# Patient Record
Sex: Female | Born: 2000 | Race: Black or African American | Hispanic: No | Marital: Single | Smoking: Never smoker
Health system: Southern US, Community
[De-identification: ages and names within clinical notes are randomized; demographics above are authoritative.]

## PROBLEM LIST (undated history)

## (undated) ENCOUNTER — Ambulatory Visit: Admission: EM | Payer: Medicaid Other | Source: Home / Self Care

---
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
## 2022-03-09 ENCOUNTER — Ambulatory Visit
Admission: RE | Admit: 2022-03-09 | Discharge: 2022-03-09 | Disposition: A | Discharge status: Home / Self Care | Payer: Medicaid Other | Source: Ambulatory Visit | Attending: Emergency Medicine | Admitting: Emergency Medicine

## 2022-03-09 VITALS — BP 98/54 | HR 89 | Temp 97.8°F | Resp 16

## 2022-03-09 DIAGNOSIS — Z8619 Personal history of other infectious and parasitic diseases: Secondary | ICD-10-CM | POA: Diagnosis not present

## 2022-03-09 DIAGNOSIS — Z113 Encounter for screening for infections with a predominantly sexual mode of transmission: Secondary | ICD-10-CM | POA: Diagnosis not present

## 2022-03-09 DIAGNOSIS — Z3202 Encounter for pregnancy test, result negative: Secondary | ICD-10-CM | POA: Diagnosis not present

## 2022-03-09 DIAGNOSIS — N760 Acute vaginitis: Secondary | ICD-10-CM | POA: Diagnosis not present

## 2022-03-09 LAB — POCT URINALYSIS DIP (MANUAL ENTRY)
Bilirubin, UA: NEGATIVE
Glucose, UA: NEGATIVE mg/dL
Ketones, POC UA: NEGATIVE mg/dL
Leukocytes, UA: NEGATIVE
Nitrite, UA: NEGATIVE
Protein Ur, POC: NEGATIVE mg/dL
Spec Grav, UA: >=1.03 — AB (ref 1.010–1.025)
Urobilinogen, UA: 0.2 E.U./dL
pH, UA: 5.5 (ref 5.0–8.0)

## 2022-03-09 LAB — POCT URINE PREGNANCY: Preg Test, Ur: NEGATIVE

## 2022-03-09 NOTE — ED Provider Notes (Signed)
[facility]    CSN: [id number] Arrival date & time: 03/09/22  0957    HISTORY   Chief Complaint  Patient presents with   Vaginal Itching    Entered by patient   HPI Bethany Thomas is a pleasant, 21 y.o. female who presents to urgent care today. Patient complains of vaginal itching and cramping in her lower abdomen for the past 4 days.  Patient states has not tried thing to alleviate her symptoms.  Patient has a history of vaginal Candida x 2 in the past year, tested positive for gonorrhea January of this year.  Patient endorses suprapubic pain, vaginal itching, and vaginal irritation.  Patient denies abnormal odor of urine, burning with urination, increased frequency of urination, perineal pain, incontinence of urine, flank pain, fever, chills, malaise, rigors, significant fatigue, abnormal vaginal discharge, genital lesion(s), and known exposure to STD.     The history is provided by the patient.  January of this year. History reviewed. No pertinent past medical history. Patient Active Problem List   Diagnosis Date Noted   Encounter for birth control 11/18/2021   Molluscum contagiosum 11/18/2021   History reviewed. No pertinent surgical history. OB History   No obstetric history on file.    Home Medications    Prior to Admission medications   Medication Sig Start Date End Date Taking? Authorizing Provider  KETOCONAZOLE, TOPICAL, 1 % SHAM Lather one application to affected area twice weekly, must leave on for five minutes prior to rinsing off 08/13/21   Crain, [place] L, PA    Family History Family History  Problem Relation Age of Onset   Healthy Mother    Healthy Father    Social History Social History   Tobacco Use   Smoking status: Never   Smokeless tobacco: Never  Vaping Use   Vaping Use: Never used  Substance Use Topics   Alcohol use: Yes    Comment: socially   Drug use: Yes    Types: Marijuana   Allergies   Patient has no known  allergies.  Review of Systems Review of Systems Pertinent findings revealed after performing a 14 point review of systems has been noted in the history of present illness.  Physical Exam Triage Vital Signs ED Triage Vitals  Enc Vitals Group     BP 01/24/21 0827 (!) 147/82     Pulse Rate 01/24/21 0827 72     Resp 01/24/21 0827 18     Temp 01/24/21 0827 98.3 F (36.8 C)     Temp Source 01/24/21 0827 Oral     SpO2 01/24/21 0827 98 %     Weight --      Height --      Head Circumference --      Peak Flow --      Pain Score 01/24/21 0826 5     Pain Loc --      Pain Edu? --      Excl. in GC? --   No data found.  Updated Vital Signs BP (!) 98/54 (BP Location: Left Arm)   Pulse 89   Temp 97.8 F (36.6 C) (Oral)   Resp 16   LMP 02/08/2022   SpO2 97%   Physical Exam Vitals and nursing note reviewed.  Constitutional:      General: She is not in acute distress.    Appearance: Normal appearance. She is not ill-appearing.  HENT:     Head: Normocephalic and atraumatic.  Eyes:     General:  Lids are normal.        Right eye: No discharge.        Left eye: No discharge.     Extraocular Movements: Extraocular movements intact.     Conjunctiva/sclera: Conjunctivae normal.     Right eye: Right conjunctiva is not injected.     Left eye: Left conjunctiva is not injected.  Neck:     Trachea: Trachea and phonation normal.  Cardiovascular:     Rate and Rhythm: Normal rate and regular rhythm.     Pulses: Normal pulses.     Heart sounds: Normal heart sounds. No murmur heard.    No friction rub. No gallop.  Pulmonary:     Effort: Pulmonary effort is normal. No accessory muscle usage, prolonged expiration or respiratory distress.     Breath sounds: Normal breath sounds. No stridor, decreased air movement or transmitted upper airway sounds. No decreased breath sounds, wheezing, rhonchi or rales.  Chest:     Chest wall: No tenderness.  Genitourinary:    Comments: Patient politely  declines pelvic exam today, patient provided a vaginal swab for testing. Musculoskeletal:        General: Normal range of motion.     Cervical back: Normal range of motion and neck supple. Normal range of motion.  Lymphadenopathy:     Cervical: No cervical adenopathy.  Skin:    General: Skin is warm and dry.     Findings: No erythema or rash.  Neurological:     General: No focal deficit present.     Mental Status: She is alert and oriented to person, place, and time.  Psychiatric:        Mood and Affect: Mood normal.        Behavior: Behavior normal.     Visual Acuity Right Eye Distance:   Left Eye Distance:   Bilateral Distance:    Right Eye Near:   Left Eye Near:    Bilateral Near:     UC Couse / Diagnostics / Procedures:     Radiology No results found.  Procedures Procedures (including critical care time) EKG  Pending results:  Labs Reviewed  POCT URINALYSIS DIP (MANUAL ENTRY) - Abnormal; Notable for the following components:      Result Value   Spec Grav, UA >=1.030 (*)    Blood, UA trace-intact (*)    All other components within normal limits  POCT URINE PREGNANCY  CERVICOVAGINAL ANCILLARY ONLY    Medications Ordered in UC: Medications - No data to display  UC Diagnoses / Final Clinical Impressions(s)   I have reviewed the triage vital signs and the nursing notes.  Pertinent labs & imaging results that were available during my care of the patient were reviewed by me and considered in my medical decision making (see chart for details).    Final diagnoses:  Screening examination for STD (sexually transmitted disease)  Vaginitis and vulvovaginitis  History of gonorrhea   STD screening was performed, patient advised that the results be posted to their MyChart and if any of the results are positive, they will be notified by phone, further treatment will be provided as indicated based on results of STD screening. Patient was advised to abstain from sexual  intercourse until that they receive the results of their STD testing.  Patient was also advised to use condoms to protect themselves from STD exposure. Urinalysis today was normal. Urine pregnancy test was negative. Return precautions advised.  Drug allergies reviewed, all questions addressed.  Please see discharge instructions below for details of plan of care as provided to patient. ED Prescriptions   None    PDMP not reviewed this encounter.  Disposition Upon Discharge:  Condition: stable for discharge home  Patient presented with concern for an acute illness with associated systemic symptoms and significant discomfort requiring urgent management. In my opinion, this is a condition that a prudent lay person (someone who possesses an average knowledge of health and medicine) may potentially expect to result in complications if not addressed urgently such as respiratory distress, impairment of bodily function or dysfunction of bodily organs.   As such, the patient has been evaluated and assessed, work-up was performed and treatment was provided in alignment with urgent care protocols and evidence based medicine.  Patient/parent/caregiver has been advised that the patient may require follow up for further testing and/or treatment if the symptoms continue in spite of treatment, as clinically indicated and appropriate.  Routine symptom specific, illness specific and/or disease specific instructions were discussed with the patient and/or caregiver at length.  Prevention strategies for avoiding STD exposure were also discussed.  The patient will follow up with their current PCP if and as advised. If the patient does not currently have a PCP we will assist them in obtaining one.   The patient may need specialty follow up if the symptoms continue, in spite of conservative treatment and management, for further workup, evaluation, consultation and treatment as clinically indicated and  appropriate.  Patient/parent/caregiver verbalized understanding and agreement of plan as discussed.  All questions were addressed during visit.  Please see discharge instructions below for further details of plan.  Discharge Instructions:   Discharge Instructions      The results of your vaginal swab test which screens for BV, yeast, gonorrhea, chlamydia and trichomonas will be made posted to your MyChart account once it is complete.  This typically takes 2 to 4 days.  Please abstain from sexual intercourse of any kind, vaginal, oral or anal, until you have received the results of your STD testing.     If any of your results are abnormal, you will receive a phone call regarding treatment.  Prescriptions, if any are needed, will be provided for you at your pharmacy.     Your urine pregnancy test today is negative.   Our point-of-care analysis of your urine sample today was normal and did not reveal any concern for urinary tract infection.   If you have not had complete resolution of your symptoms after completing any needed treatment, please return for repeat evaluation.   Thank you for visiting urgent care today.  I appreciate the opportunity to participate in your care.       This office note has been dictated using Teaching laboratory technician.  Unfortunately, this method of dictation can sometimes lead to typographical or grammatical errors.  I apologize for your inconvenience in advance if this occurs.  Please do not hesitate to reach out to me if clarification is needed.       Theadora Rama Scales, PA-C 03/09/22 1103

## 2022-03-09 NOTE — Discharge Instructions (Signed)
The results of your vaginal swab test which screens for BV, yeast, gonorrhea, chlamydia and trichomonas will be made posted to your MyChart account once it is complete.  This typically takes 2 to 4 days.  Please abstain from sexual intercourse of any kind, vaginal, oral or anal, until you have received the results of your STD testing.     If any of your results are abnormal, you will receive a phone call regarding treatment.  Prescriptions, if any are needed, will be provided for you at your pharmacy.     Your urine pregnancy test today is negative.   Our point-of-care analysis of your urine sample today was normal and did not reveal any concern for urinary tract infection.   If you have not had complete resolution of your symptoms after completing any needed treatment, please return for repeat evaluation.   Thank you for visiting urgent care today.  I appreciate the opportunity to participate in your care.  

## 2022-03-09 NOTE — ED Triage Notes (Signed)
Pt c/o vaginal discomfort and lower abd cramping x4 days.   Home interventions: none

## 2022-03-10 ENCOUNTER — Telehealth (HOSPITAL_COMMUNITY): Payer: Self-pay | Admitting: Emergency Medicine

## 2022-03-10 LAB — CERVICOVAGINAL ANCILLARY ONLY
Bacterial Vaginitis (gardnerella): NEGATIVE
Candida Glabrata: NEGATIVE
Candida Vaginitis: POSITIVE — AB
Chlamydia: NEGATIVE
Comment: NEGATIVE
Comment: NEGATIVE
Comment: NEGATIVE
Comment: NEGATIVE
Comment: NEGATIVE
Comment: NORMAL
Neisseria Gonorrhea: NEGATIVE
Trichomonas: NEGATIVE

## 2022-03-10 MED ORDER — FLUCONAZOLE 150 MG PO TABS: 150.0000 mg | ORAL_TABLET | Freq: Once | ORAL | 0 refills | Status: AC

## 2022-04-22 DIAGNOSIS — N939 Abnormal uterine and vaginal bleeding, unspecified: Secondary | ICD-10-CM | POA: Diagnosis not present

## 2022-09-04 ENCOUNTER — Ambulatory Visit
Admission: EM | Admit: 2022-09-04 | Discharge: 2022-09-04 | Disposition: A | Discharge status: Home / Self Care | Payer: Medicaid Other | Attending: Urgent Care | Admitting: Urgent Care

## 2022-09-04 DIAGNOSIS — N760 Acute vaginitis: Secondary | ICD-10-CM | POA: Insufficient documentation

## 2022-09-04 LAB — POCT URINE PREGNANCY: Preg Test, Ur: NEGATIVE

## 2022-09-04 NOTE — ED Provider Notes (Signed)
[facility]  Note:  This document was prepared using Conservation officer, historic buildings and may include unintentional dictation errors.  MRN: [id number] DOB: [birth year]  Subjective:   Bethany Thomas is a 22 y.o. female presenting for 1 week history of vaginal discharge.  Patient has a history of yeast infections.  Has not been very frequent.  She is also had BV in the past.  No concern for STI but would like to get all the testing done through the vaginal cytology.  Denies fever, n/v, abdominal pain, pelvic pain, rashes, dysuria, urinary frequency, hematuria.    No current facility-administered medications for this encounter.  Current Outpatient Medications:    KETOCONAZOLE, TOPICAL, 1 % SHAM, Lather one application to affected area twice weekly, must leave on for five minutes prior to rinsing off, Disp: 200 mL, Rfl: 0   No Known Allergies  History reviewed. No pertinent past medical history.   History reviewed. No pertinent surgical history.  Family History  Problem Relation Age of Onset   Healthy Mother    Healthy Father     Social History   Tobacco Use   Smoking status: Never   Smokeless tobacco: Never  Vaping Use   Vaping Use: Never used  Substance Use Topics   Alcohol use: Yes    Comment: occ   Drug use: Yes    Types: Marijuana    ROS   Objective:   Vitals: BP 110/80 (BP Location: Right Arm)   Pulse 80   Temp 99.1 F (37.3 C) (Oral)   Resp 16   LMP 08/20/2022 (Approximate)   SpO2 98%   Physical Exam Constitutional:      General: She is not in acute distress.    Appearance: Normal appearance. She is well-developed. She is not ill-appearing, toxic-appearing or diaphoretic.  HENT:     Head: Normocephalic and atraumatic.     Nose: Nose normal.     Mouth/Throat:     Mouth: Mucous membranes are moist.  Eyes:     General: No scleral icterus.       Right eye: No discharge.        Left eye: No discharge.     Extraocular Movements:  Extraocular movements intact.  Cardiovascular:     Rate and Rhythm: Normal rate.  Pulmonary:     Effort: Pulmonary effort is normal.  Skin:    General: Skin is warm and dry.  Neurological:     General: No focal deficit present.     Mental Status: She is alert and oriented to person, place, and time.  Psychiatric:        Mood and Affect: Mood normal.        Behavior: Behavior normal.     Results for orders placed or performed during the hospital encounter of 09/04/22 (from the past 24 hour(s))  POCT urine pregnancy     Status: None   Collection Time: 09/04/22  9:50 AM  Result Value Ref Range   Preg Test, Ur Negative Negative    Assessment and Plan :   PDMP not reviewed this encounter.  1. Acute vaginitis    I offered empiric treatment for recurrent yeast vaginitis but patient declined for now.  She prefers to see results first.  I did offer that if she continues to have symptoms over the weekend that she can call the clinic back if she changes her mind for prescription for fluconazole.  Otherwise we will follow-up as the results return.  Wallis Bamberg, [place] 09/04/22 [phone]

## 2022-09-04 NOTE — Discharge Instructions (Addendum)
We will let you know what your results are on Monday. In the meantime, if your symptoms worsen over the weekend, call the clinic and I will send a prescription for fluconazole to address a suspected yeast infection.

## 2022-09-04 NOTE — ED Triage Notes (Signed)
Pt c/o vaginal d/c x 1 week-requesting STD screening-NAD-steady gait 

## 2022-09-07 LAB — CERVICOVAGINAL ANCILLARY ONLY
Bacterial Vaginitis (gardnerella): NEGATIVE
Candida Glabrata: NEGATIVE
Candida Vaginitis: POSITIVE — AB
Chlamydia: NEGATIVE
Comment: NEGATIVE
Comment: NEGATIVE
Comment: NEGATIVE
Comment: NEGATIVE
Comment: NEGATIVE
Comment: NORMAL
Neisseria Gonorrhea: NEGATIVE
Trichomonas: NEGATIVE

## 2022-09-08 ENCOUNTER — Telehealth (HOSPITAL_COMMUNITY): Payer: Self-pay | Admitting: Emergency Medicine

## 2022-09-08 MED ORDER — FLUCONAZOLE 150 MG PO TABS: 150.0000 mg | ORAL_TABLET | Freq: Once | ORAL | 0 refills | Status: AC

## 2022-12-01 DIAGNOSIS — N760 Acute vaginitis: Secondary | ICD-10-CM | POA: Diagnosis not present

## 2023-01-07 DIAGNOSIS — R519 Headache, unspecified: Secondary | ICD-10-CM | POA: Diagnosis not present

## 2023-03-17 ENCOUNTER — Ambulatory Visit
Admission: RE | Admit: 2023-03-17 | Discharge: 2023-03-17 | Disposition: A | Discharge status: Home / Self Care | Payer: Medicaid Other | Source: Ambulatory Visit | Attending: Internal Medicine | Admitting: Internal Medicine

## 2023-03-17 VITALS — BP 108/69 | HR 66 | Temp 98.0°F | Resp 16

## 2023-03-17 DIAGNOSIS — N939 Abnormal uterine and vaginal bleeding, unspecified: Secondary | ICD-10-CM | POA: Insufficient documentation

## 2023-03-17 LAB — POCT URINALYSIS DIP (MANUAL ENTRY)
Bilirubin, UA: NEGATIVE
Blood, UA: NEGATIVE
Glucose, UA: NEGATIVE mg/dL
Ketones, POC UA: NEGATIVE mg/dL
Leukocytes, UA: NEGATIVE
Nitrite, UA: NEGATIVE
Protein Ur, POC: NEGATIVE mg/dL
Spec Grav, UA: 1.02 (ref 1.010–1.025)
Urobilinogen, UA: 0.2 U/dL
pH, UA: 6.5 (ref 5.0–8.0)

## 2023-03-17 LAB — POCT URINE PREGNANCY: Preg Test, Ur: NEGATIVE

## 2023-03-17 NOTE — Discharge Instructions (Addendum)
Clinical contact you with results of the STD testing done today.  Please follow-up with gynecology for further workup of your abnormal uterine bleeding.  Please go to the ER for any worsening symptoms.  I hope you feel better soon!

## 2023-03-17 NOTE — ED Triage Notes (Signed)
Pt presents to UC for c/o vaginal bleeding since the end of her period on 12/7. Pt states she has continued to have spotting since then. Denies abd pain, cramping, burning, or vaginal itching.

## 2023-03-17 NOTE — ED Provider Notes (Signed)
[facility]    CSN: [id number] Arrival date & time: 03/17/23  0901      History   Chief Complaint No chief complaint on file.   HPI Bethany Thomas is a 22 y.o. female presents for vaginal bleeding.  Patient states her period was ending on 12/7 but then reached as she has been having spotting.  Reports blood only when she wipes.  States is not enough to wear a pad or tampon and she is only wearing panty liners.  Denies any abdominal pain/cramping/dysuria, vaginal discharge.  No STD exposure but she would like screening.  She does not use any type of birth control.  States she has never had something like this happen to her in the past but chart review shows she was seen at [facility] urgent care in January 2024 for abnormal uterine bleeding.  She was checked for STDs and advised to follow-up with PCP or GYN for further workup. She has not had a pap smear due to being under 21.  No other concerns at this time.  HPI  History reviewed. No pertinent past medical history.  Patient Active Problem List   Diagnosis Date Noted   Encounter for birth control 11/18/2021   Molluscum contagiosum 11/18/2021    History reviewed. No pertinent surgical history.  OB History   No obstetric history on file.      Home Medications    Prior to Admission medications   Medication Sig Start Date End Date Taking? Authorizing Provider  KETOCONAZOLE, TOPICAL, 1 % SHAM Lather one application to affected area twice weekly, must leave on for five minutes prior to rinsing off 08/13/21   Crain, Frederick L, PA    Family History Family History  Problem Relation Age of Onset   Healthy Mother    Healthy Father     Social History Social History   Tobacco Use   Smoking status: Every Day    Types: Cigars   Smokeless tobacco: Never  Vaping Use   Vaping status: Never Used  Substance Use Topics   Alcohol use: Yes    Comment: occ   Drug use: Not Currently    Types: Marijuana     Allergies    Patient has no known allergies.   Review of Systems Review of Systems  Genitourinary:  Positive for vaginal bleeding.     Physical Exam Triage Vital Signs ED Triage Vitals  Encounter Vitals Group     BP 03/17/23 0911 108/69     Systolic BP Percentile --      Diastolic BP Percentile --      Pulse Rate 03/17/23 0911 66     Resp 03/17/23 0911 16     Temp 03/17/23 0911 98 F (36.7 C)     Temp Source 03/17/23 0911 Oral     SpO2 03/17/23 0911 98 %     Weight --      Height --      Head Circumference --      Peak Flow --      Pain Score 03/17/23 0909 0     Pain Loc --      Pain Education --      Exclude from Growth Chart --    No data found.  Updated Vital Signs BP 108/69 (BP Location: Right Arm)   Pulse 66   Temp 98 F (36.7 C) (Oral)   Resp 16   LMP 02/28/2023 (Exact Date)   SpO2 98%   Visual Acuity Right  Eye Distance:   Left Eye Distance:   Bilateral Distance:    Right Eye Near:   Left Eye Near:    Bilateral Near:     Physical Exam Vitals and nursing note reviewed.  Constitutional:      General: She is not in acute distress.    Appearance: Normal appearance. She is not ill-appearing.  HENT:     Head: Normocephalic and atraumatic.  Eyes:     Pupils: Pupils are equal, round, and reactive to light.  Cardiovascular:     Rate and Rhythm: Normal rate.  Pulmonary:     Effort: Pulmonary effort is normal.  Abdominal:     Tenderness: There is no right CVA tenderness or left CVA tenderness.  Skin:    General: Skin is warm and dry.  Neurological:     General: No focal deficit present.     Mental Status: She is alert and oriented to person, place, and time.  Psychiatric:        Mood and Affect: Mood normal.        Behavior: Behavior normal.      UC Treatments / Results  Labs (all labs ordered are listed, but only abnormal results are displayed) Labs Reviewed  POCT URINE PREGNANCY  POCT URINALYSIS DIP (MANUAL ENTRY)  CERVICOVAGINAL ANCILLARY ONLY     EKG   Radiology No results found.  Procedures Procedures (including critical care time)  Medications Ordered in UC Medications - No data to display  Initial Impression / Assessment and Plan / UC Course  I have reviewed the triage vital signs and the nursing notes.  Pertinent labs & imaging results that were available during my care of the patient were reviewed by me and considered in my medical decision making (see chart for details).     Reviewed exam and symptoms with patient.  No red flags.  UA and hCG negative.  Will send STD testing as ordered and contact for any positive results.  Advised patient to follow-up with gynecology for further workup of her abnormal uterine bleeding.  ER precautions reviewed. Final Clinical Impressions(s) / UC Diagnoses   Final diagnoses:  Abnormal vaginal bleeding     Discharge Instructions      Clinical contact you with results of the STD testing done today.  Please follow-up with gynecology for further workup of your abnormal uterine bleeding.  Please go to the ER for any worsening symptoms.  I hope you feel better soon!     ED Prescriptions   None    PDMP not reviewed this encounter.   Radford Pax, NP 03/17/23 [phone]

## 2023-03-18 LAB — CERVICOVAGINAL ANCILLARY ONLY
Chlamydia: NEGATIVE
Comment: NEGATIVE
Comment: NEGATIVE
Comment: NORMAL
Neisseria Gonorrhea: NEGATIVE
Trichomonas: NEGATIVE

## 2023-05-19 IMAGING — CT CT ABD-PELV W/ CM
2 of 4 series · 16 of 46 positions shown, 18 images · IV contrast (APPLIED)
Comparison: None.

CLINICAL DATA: Abdominal pain

EXAM:
CT ABDOMEN AND PELVIS WITH CONTRAST
TECHNIQUE: Multidetector CT imaging of the abdomen and pelvis was performed
using the standard protocol following bolus administration of
intravenous contrast.
CONTRAST:  80mL OMNIPAQUE IOHEXOL 300 MG/ML  SOLN

[Series 2: abd pel w · axial · 0.64mm/px · z∈[+658,+1043]mm · 13 of 85 slices shown, 15 images]
[im 4/85  soft-tissue]
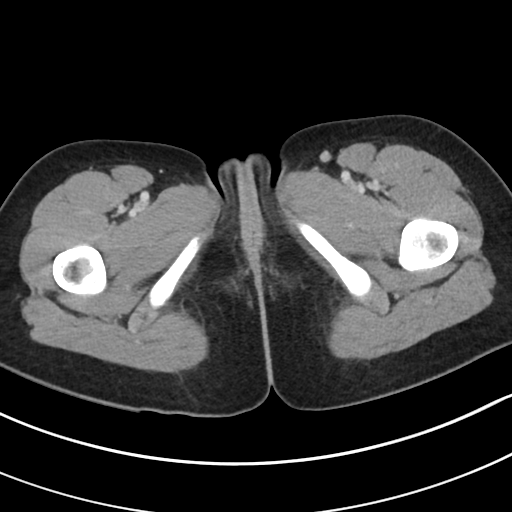
[im 4/85  bone]
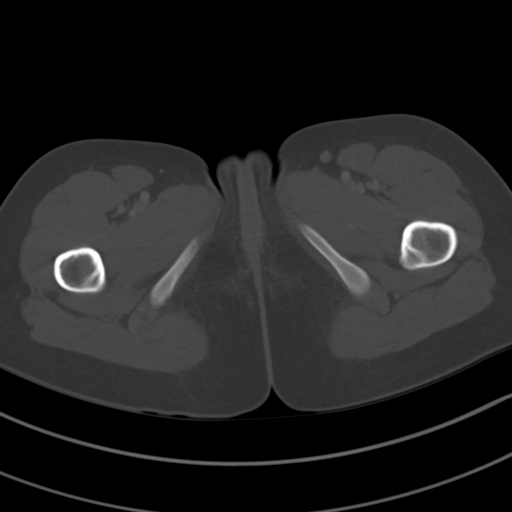
[im 10/85  soft-tissue]
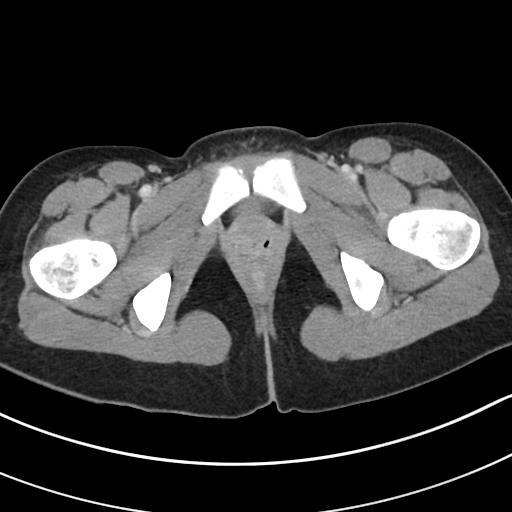
[im 17/85  soft-tissue]
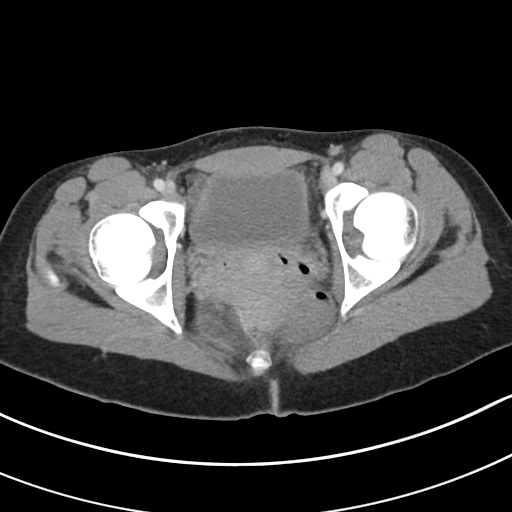
[im 23/85  soft-tissue]
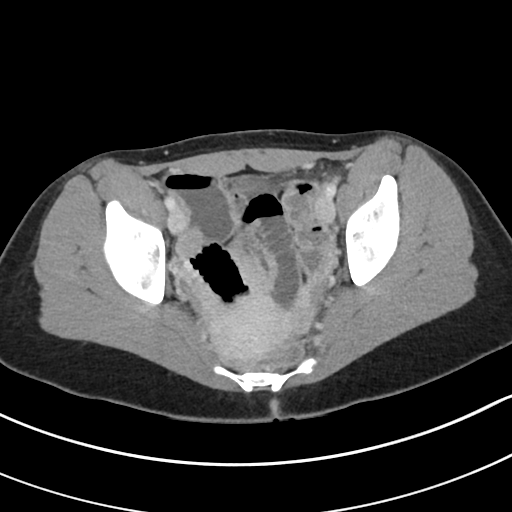
[im 30/85  soft-tissue]
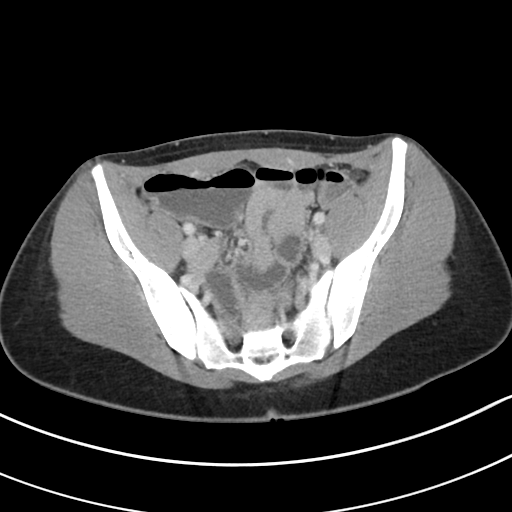
[im 36/85  soft-tissue]
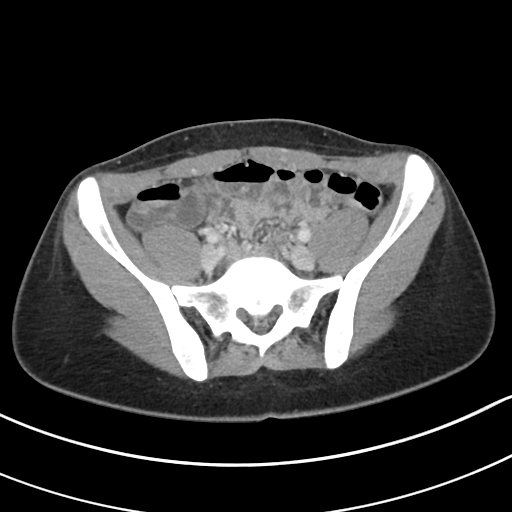
[im 43/85  soft-tissue]
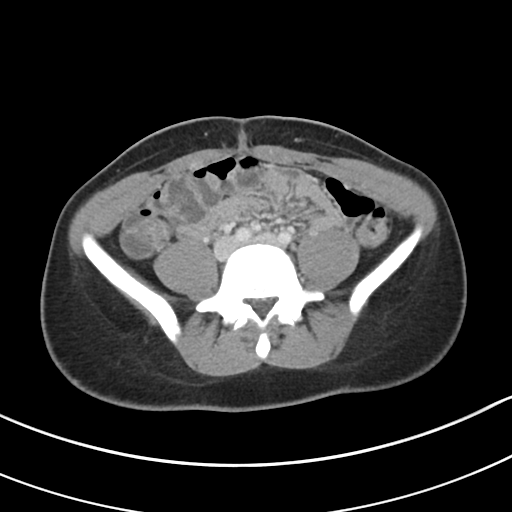
[im 49/85  soft-tissue]
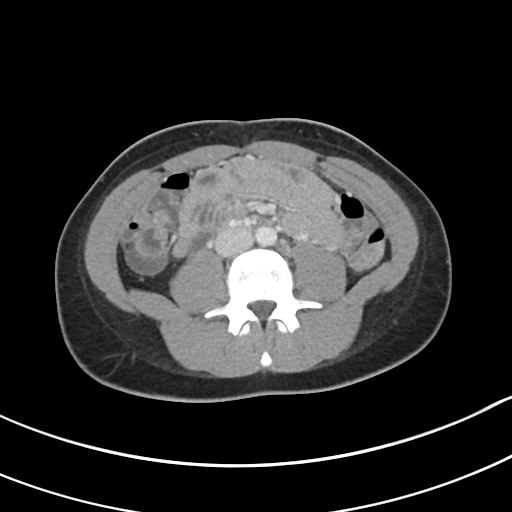
[im 55/85  soft-tissue]
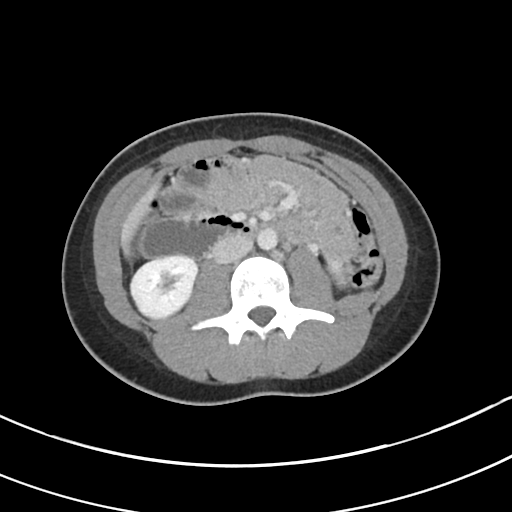
[im 55/85  bone]
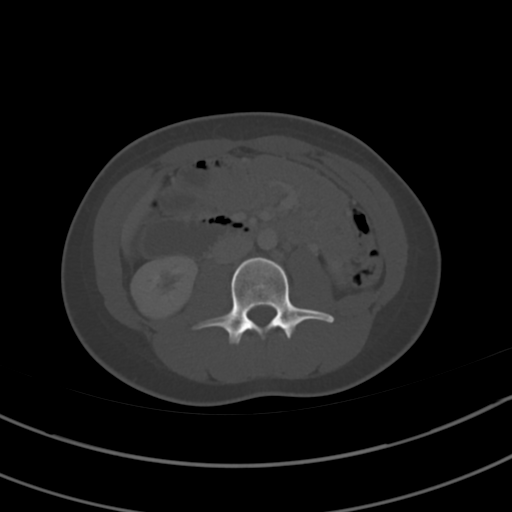
[im 62/85  soft-tissue]
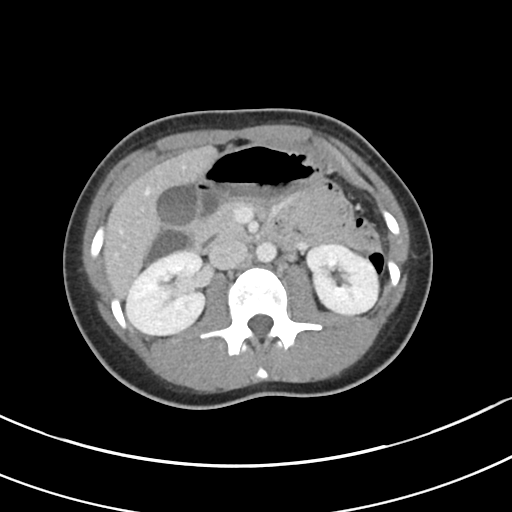
[im 68/85  soft-tissue]
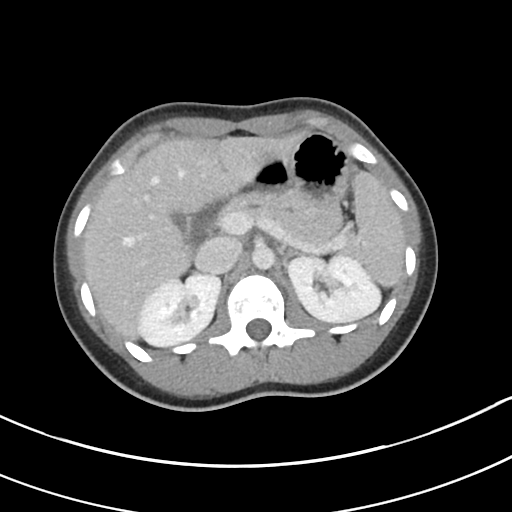
[im 75/85  soft-tissue]
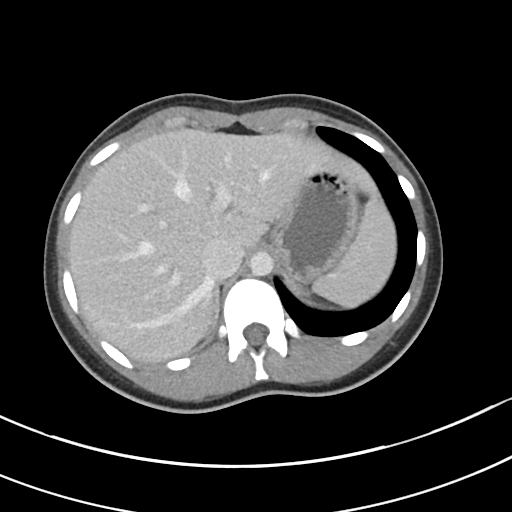
[im 81/85  soft-tissue]
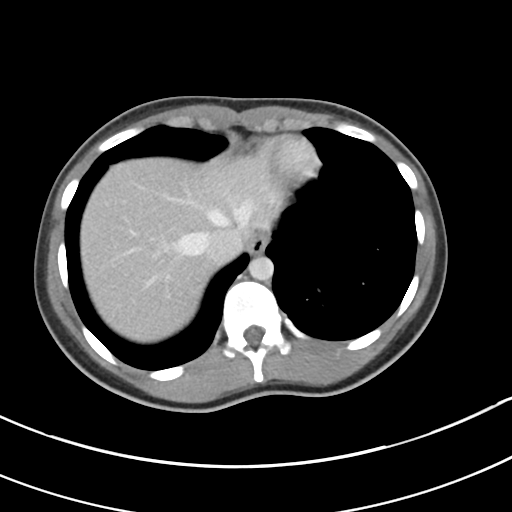

[Series 5: coronal · coronal · 0.67mm/px · 3 of 72 slices shown]
[im 24/72  soft-tissue]
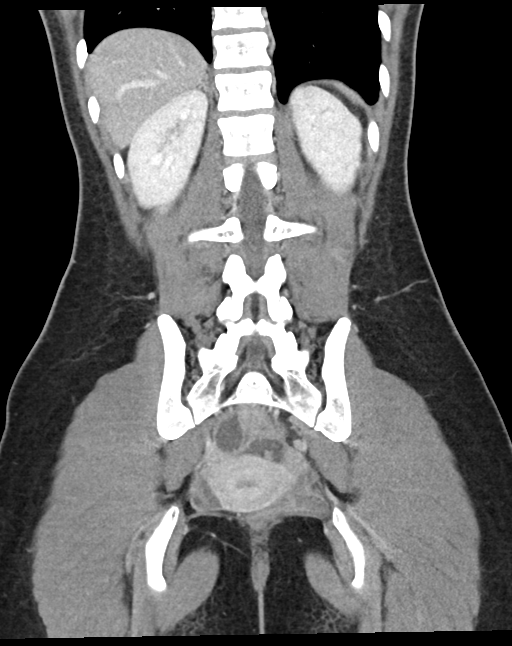
[im 32/72  soft-tissue]
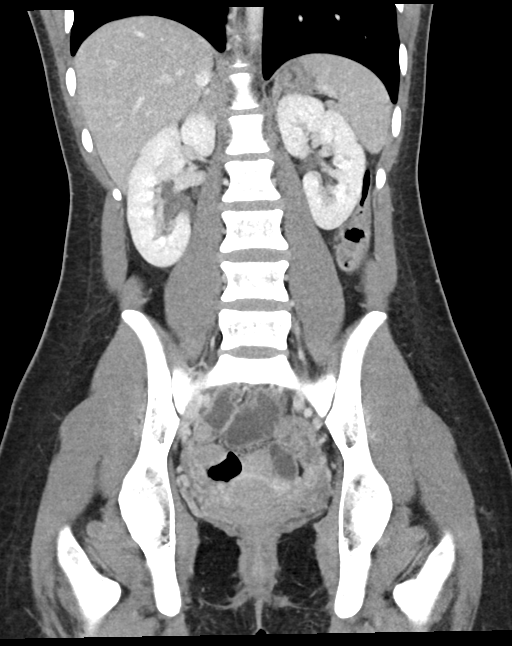
[im 40/72  soft-tissue]
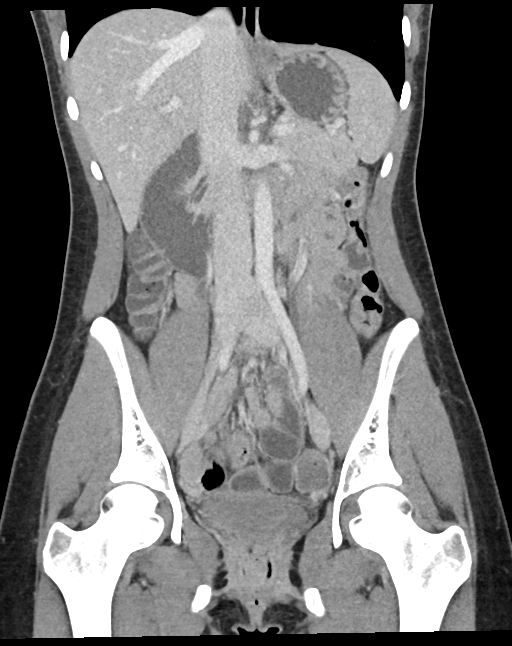

[16 of 46 positions shown; findings below may reference images not displayed]

FINDINGS: Lower chest: Unremarkable.

Hepatobiliary: No focal abnormality is seen in the liver.
Gallbladder is unremarkable.

Pancreas: There is prominence of pancreatic duct. No focal
abnormality is seen in the pancreas.

Spleen: Unremarkable.

Adrenals/Urinary Tract: Adrenals are unremarkable. There is no
hydronephrosis. There are no renal or ureteral stones. Urinary
bladder is not distended.

Stomach/Bowel: Stomach is unremarkable. There is fluid in the lumen
of duodenum. There is fluid in the lumen of slightly dilated distal
small bowel loops. Appendix is not distinctly seen. There is no
focal pericecal inflammation. There is fluid in the lumen of right
colon. There is no significant wall thickening in the colon. There
is no pericolic stranding.

Vascular/Lymphatic: Unremarkable.

Reproductive: Uterus is retroverted.

Other: There is no ascites or pneumoperitoneum.

Musculoskeletal: Unremarkable.
IMPRESSION: There is no evidence intestinal obstruction or pneumoperitoneum.
There is no hydronephrosis.

There is fluid in the small bowel loops, especially in the distal
ileum with mild dilation. Findings suggest possible nonspecific
enteritis.

Other findings as described in the body of the report.

## 2023-06-04 DIAGNOSIS — R112 Nausea with vomiting, unspecified: Secondary | ICD-10-CM | POA: Diagnosis not present

## 2023-06-08 ENCOUNTER — Ambulatory Visit: Payer: Medicaid Other | Admitting: Family Medicine

## 2023-09-02 ENCOUNTER — Encounter: Payer: Self-pay | Admitting: Family Medicine

## 2023-09-02 ENCOUNTER — Ambulatory Visit (INDEPENDENT_AMBULATORY_CARE_PROVIDER_SITE_OTHER): Admitting: Family Medicine

## 2023-09-02 VITALS — BP 100/74 | HR 76 | Temp 98.2°F | Ht 69.0 in | Wt 134.0 lb

## 2023-09-02 DIAGNOSIS — Z0000 Encounter for general adult medical examination without abnormal findings: Secondary | ICD-10-CM

## 2023-09-02 DIAGNOSIS — R110 Nausea: Secondary | ICD-10-CM | POA: Diagnosis not present

## 2023-09-02 MED ORDER — ONDANSETRON HCL 4 MG PO TABS: 4.0000 mg | ORAL_TABLET | Freq: Three times a day (TID) | ORAL | 0 refills | Status: AC | PRN

## 2023-09-02 NOTE — Progress Notes (Signed)
 Complete physical exam  Patient: Bethany Thomas   DOB: 2000/08/15   23 y.o. Female  MRN: [id number]  Subjective:     Chief Complaint  Patient presents with   Establish Care    Jessicaann Overbaugh is a 23 y.o. female who presents today for a complete physical exam. She reports consuming a general diet. The patient does not participate in regular exercise at present. She generally feels well. She reports sleeping well. She does not have additional problems to discuss today.   Pt was previously seen by her pediatrician. She reports she does not have any new symptoms or issues to discuss today. Has not been diagnosed with any chronic medical issues or conditions. Does not take any medications regularly .  Most recent fall risk assessment:     No data to display           Most recent depression screenings:    09/02/2023    9:32 AM 11/18/2021   10:26 AM  PHQ 2/9 Scores  PHQ - 2 Score 2 1  PHQ- 9 Score 5 4    Vision:Not within last year  and no problems with vision and Dental: No current dental problems and Receives regular dental care  Patient Active Problem List   Diagnosis Date Noted   Encounter for birth control 11/18/2021   Molluscum contagiosum 11/18/2021      Patient Care Team: Aida House, MD as PCP - General (Family Medicine)   Outpatient Medications Prior to Visit  Medication Sig   [DISCONTINUED] KETOCONAZOLE , TOPICAL, 1 % SHAM Lather one application to affected area twice weekly, must leave on for five minutes prior to rinsing off   No facility-administered medications prior to visit.    Review of Systems  HENT:  Negative for hearing loss.   Eyes:  Negative for blurred vision.  Respiratory:  Negative for shortness of breath.   Cardiovascular:  Negative for chest pain.  Gastrointestinal: Negative.   Genitourinary: Negative.   Musculoskeletal:  Negative for back pain.  Neurological:  Negative for headaches.  Psychiatric/Behavioral:  Negative for depression.         Objective:     BP 100/74   Pulse 76   Temp 98.2 F (36.8 C) (Oral)   Ht 5\' 9"  (1.753 m)   Wt 134 lb (60.8 kg)   LMP 08/23/2023   SpO2 97%   BMI 19.79 kg/m    Physical Exam Vitals reviewed.  Constitutional:      Appearance: Normal appearance. She is well-groomed and normal weight.  HENT:     Right Ear: Tympanic membrane and ear canal normal.     Left Ear: Tympanic membrane and ear canal normal.     Mouth/Throat:     Mouth: Mucous membranes are moist.     Pharynx: No posterior oropharyngeal erythema.  Eyes:     Conjunctiva/sclera: Conjunctivae normal.  Neck:     Thyroid: No thyromegaly.  Cardiovascular:     Rate and Rhythm: Normal rate and regular rhythm.     Pulses: Normal pulses.     Heart sounds: S1 normal and S2 normal.  Pulmonary:     Effort: Pulmonary effort is normal.     Breath sounds: Normal breath sounds and air entry.  Abdominal:     General: Abdomen is flat. Bowel sounds are normal.     Palpations: Abdomen is soft.  Musculoskeletal:     Right lower leg: No edema.     Left lower leg: No edema.  Lymphadenopathy:     Cervical: No cervical adenopathy.  Neurological:     Mental Status: She is alert and oriented to person, place, and time. Mental status is at baseline.     Gait: Gait is intact.  Psychiatric:        Mood and Affect: Mood and affect normal.        Speech: Speech normal.        Behavior: Behavior normal.        Judgment: Judgment normal.      No results found for any visits on 09/02/23.     Assessment & Plan:    Routine Health Maintenance and Physical Exam  Immunization History  Administered Date(s) Administered   Hpv-Unspecified 07/16/2013, 06/12/2014   Tdap 06/10/2021    Health Maintenance  Topic Date Due   Meningococcal B Vaccine (1 of 2 - Standard) Never done   COVID-19 Vaccine (1 - 2024-25 season) Never done   Cervical Cancer Screening (Pap smear)  Never done   INFLUENZA VACCINE  10/29/2023   CHLAMYDIA  SCREENING  03/16/2024   DTaP/Tdap/Td (2 - Td or Tdap) 06/11/2031   HPV VACCINES  Completed   Hepatitis C Screening  Completed   HIV Screening  Completed    Discussed health benefits of physical activity, and encouraged her to engage in regular exercise appropriate for her age and condition.  Routine adult health maintenance  Nausea -     Ondansetron  HCl; Take 1 tablet (4 mg total) by mouth every 8 (eight) hours as needed for nausea or vomiting.  Dispense: 30 tablet; Refill: 0  I reviewed the patient's GAD and PHQ scores which were slightly high. I counseled the patient on stress management strategies  and options for treatment if she felt her symptoms were getting worse. I also counseled the patient on the pap smear/well woman visit and will have her back for this at a later visit. I reviewed her vaccinations, she is UTD per her report. Handouts given on healthy eating and exercise. I offered annual labs however pt declined.   Return in about 2 months (around 11/02/2023) for needs pap .     Aida House, MD

## 2023-09-02 NOTE — Patient Instructions (Signed)
 May use Ibuprofen  800 mg every 8 hours as needed for period symptoms

## 2023-10-05 ENCOUNTER — Ambulatory Visit (INDEPENDENT_AMBULATORY_CARE_PROVIDER_SITE_OTHER)

## 2023-10-05 ENCOUNTER — Ambulatory Visit
Admission: EM | Admit: 2023-10-05 | Discharge: 2023-10-05 | Disposition: A | Discharge status: Home / Self Care | Attending: Family Medicine | Admitting: Family Medicine

## 2023-10-05 DIAGNOSIS — M2011 Hallux valgus (acquired), right foot: Secondary | ICD-10-CM | POA: Diagnosis not present

## 2023-10-05 DIAGNOSIS — S93501A Unspecified sprain of right great toe, initial encounter: Secondary | ICD-10-CM | POA: Diagnosis not present

## 2023-10-05 DIAGNOSIS — S99921A Unspecified injury of right foot, initial encounter: Secondary | ICD-10-CM | POA: Diagnosis not present

## 2023-10-05 DIAGNOSIS — M79674 Pain in right toe(s): Secondary | ICD-10-CM | POA: Diagnosis not present

## 2023-10-05 NOTE — Discharge Instructions (Signed)
 Your x-ray is negative for fracture.  You may elevate ice and take over-the-counter ibuprofen  or Tylenol as needed for your toe pain.  Please follow-up with your PCP if your symptoms do not improve.  Please go to the ER for any worsening symptoms.  Hope you feel better soon!

## 2023-10-05 NOTE — ED Provider Notes (Signed)
 [facility]    CSN: [id number] Arrival date & time: 10/05/23  1749      History   Chief Complaint Chief Complaint  Patient presents with   Foot Injury    HPI Bethany Thomas is a 23 y.o. female presents for toe pain.  Patient reports yesterday she was running up some stairs when she hit her right great toe on the stair.  She states since then she is not having toe pain with some mild swelling.  Also reports a blood blister to the dorsal aspect of the toe.  No numbness/tingling/weakness.  No redness or warmth.  No foot pain.  No history of injuries or surgeries to the foot or toe in the past.  She has not used any OTC treatments for symptoms.  No other concerns at this time.   Foot Injury   History reviewed. No pertinent past medical history.  Patient Active Problem List   Diagnosis Date Noted   Encounter for birth control 11/18/2021   Molluscum contagiosum 11/18/2021    History reviewed. No pertinent surgical history.  OB History   No obstetric history on file.      Home Medications    Prior to Admission medications   Medication Sig Start Date End Date Taking? Authorizing Provider  ondansetron  (ZOFRAN ) 4 MG tablet Take 1 tablet (4 mg total) by mouth every 8 (eight) hours as needed for nausea or vomiting. 09/02/23   Ozell Heron HERO, MD    Family History Family History  Problem Relation Age of Onset   Healthy Mother    Healthy Father     Social History Social History   Tobacco Use   Smoking status: Never   Smokeless tobacco: Never  Vaping Use   Vaping status: Never Used  Substance Use Topics   Alcohol use: Yes    Comment: occasionally   Drug use: Yes    Types: Marijuana     Allergies   Patient has no known allergies.   Review of Systems Review of Systems  Musculoskeletal:        Right great toe pain     Physical Exam Triage Vital Signs ED Triage Vitals  Encounter Vitals Group     BP 10/05/23 1758 116/76     Girls Systolic BP  Percentile --      Girls Diastolic BP Percentile --      Boys Systolic BP Percentile --      Boys Diastolic BP Percentile --      Pulse Rate 10/05/23 1758 65     Resp 10/05/23 1758 18     Temp 10/05/23 1758 98.8 F (37.1 C)     Temp Source 10/05/23 1758 Oral     SpO2 10/05/23 1758 98 %     Weight --      Height --      Head Circumference --      Peak Flow --      Pain Score 10/05/23 1757 7     Pain Loc --      Pain Education --      Exclude from Growth Chart --    No data found.  Updated Vital Signs BP 116/76 (BP Location: Right Arm)   Pulse 65   Temp 98.8 F (37.1 C) (Oral)   Resp 18   LMP 09/24/2023 (Exact Date)   SpO2 98%   Visual Acuity Right Eye Distance:   Left Eye Distance:   Bilateral Distance:    Right Eye  Near:   Left Eye Near:    Bilateral Near:     Physical Exam Vitals and nursing note reviewed.  Constitutional:      General: She is not in acute distress.    Appearance: Normal appearance. She is not ill-appearing.  HENT:     Head: Normocephalic and atraumatic.  Eyes:     Pupils: Pupils are equal, round, and reactive to light.  Cardiovascular:     Rate and Rhythm: Normal rate.  Pulmonary:     Effort: Pulmonary effort is normal.  Musculoskeletal:     Comments: There is no swelling, erythema, ecchymosis of the right great toe.  Tender to palpation from first MTP joint to distal toe.  Nail intact.  Cap refill +2.  There is a small blood blister overlying the DIP joint.  Full range of motion of toe with pain on flexion and extension.  No tenderness with palpation to the dorsal aspect of the foot  Skin:    General: Skin is warm and dry.  Neurological:     General: No focal deficit present.     Mental Status: She is alert and oriented to person, place, and time.  Psychiatric:        Mood and Affect: Mood normal.        Behavior: Behavior normal.      UC Treatments / Results  Labs (all labs ordered are listed, but only abnormal results are  displayed) Labs Reviewed - No data to display  EKG   Radiology DG Toe Great Right Result Date: 10/05/2023 CLINICAL DATA:  hit toe on stairs yesterday EXAM: RIGHT GREAT TOE COMPARISON:  None Available. FINDINGS: There is no evidence of fracture or dislocation. Trace hallux valgus. There is no evidence of arthropathy or other focal bone abnormality. Soft tissues are unremarkable. IMPRESSION: No acute displaced fracture or dislocation. Electronically Signed   By: Morgane  Naveau M.D.   On: 10/05/2023 18:50    Procedures Procedures (including critical care time)  Medications Ordered in UC Medications - No data to display  Initial Impression / Assessment and Plan / UC Course  I have reviewed the triage vital signs and the nursing notes.  Pertinent labs & imaging results that were available during my care of the patient were reviewed by me and considered in my medical decision making (see chart for details).     Reviewed exam and symptoms with patient, no red flags.  X-ray negative for fracture.  Discussed toe sprain and RICE therapy.  OTC analgesics as needed.  PCP follow-up if symptoms do not improve.  ER precautions reviewed. Final Clinical Impressions(s) / UC Diagnoses   Final diagnoses:  Great toe pain, right  Sprain of right great toe, initial encounter     Discharge Instructions      Your x-ray is negative for fracture.  You may elevate ice and take over-the-counter ibuprofen  or Tylenol as needed for your toe pain.  Please follow-up with your PCP if your symptoms do not improve.  Please go to the ER for any worsening symptoms.  Hope you feel better soon!     ED Prescriptions   None    PDMP not reviewed this encounter.   Loreda Myla SAUNDERS, NP 10/05/23 [phone]

## 2023-10-05 NOTE — ED Triage Notes (Signed)
 Pt present with rt foot pain. States she was running up wooden steps while running away from a bug. Pt states she is not sure how she injured her foot during the time she was going up the steps. Pt states she feels a throbbing and aching pain.

## 2023-10-06 ENCOUNTER — Encounter: Payer: Self-pay | Admitting: Family Medicine

## 2023-10-06 ENCOUNTER — Other Ambulatory Visit (HOSPITAL_COMMUNITY)
Admission: RE | Admit: 2023-10-06 | Discharge: 2023-10-06 | Disposition: A | Discharge status: Home / Self Care | Source: Ambulatory Visit | Attending: Family Medicine | Admitting: Family Medicine

## 2023-10-06 ENCOUNTER — Ambulatory Visit (INDEPENDENT_AMBULATORY_CARE_PROVIDER_SITE_OTHER): Admitting: Family Medicine

## 2023-10-06 VITALS — BP 96/64 | HR 68 | Temp 98.4°F | Ht 69.0 in | Wt 138.7 lb

## 2023-10-06 DIAGNOSIS — Z1239 Encounter for other screening for malignant neoplasm of breast: Secondary | ICD-10-CM | POA: Diagnosis not present

## 2023-10-06 DIAGNOSIS — Z124 Encounter for screening for malignant neoplasm of cervix: Secondary | ICD-10-CM

## 2023-10-06 DIAGNOSIS — N898 Other specified noninflammatory disorders of vagina: Secondary | ICD-10-CM

## 2023-10-06 NOTE — Patient Instructions (Signed)

## 2023-10-06 NOTE — Progress Notes (Signed)
 Established Patient Office Visit  Subjective   Patient ID: Bethany Thomas, female    DOB: 2000-04-09  Age: 23 y.o. MRN: [id number]  Chief Complaint  Patient presents with   Gynecologic Exam    LMP was June 27, pt has been sexually active in the past but is not currently. She reports fairly severe cramping with periods, but states that her periods are regular, sometimes longer than 28 days in between. Pt states that would like to know the options for birth control. I counseled the patient for 10 minutes on birth control options including pills, implants, IUDS. She reports she would like STI testing today and she denies any current symptoms, no vaginal discharge or pelvic pain.    Current Outpatient Medications  Medication Instructions   ondansetron  (ZOFRAN ) 4 mg, Oral, Every 8 hours PRN    Patient Active Problem List   Diagnosis Date Noted   Encounter for birth control 11/18/2021   Molluscum contagiosum 11/18/2021      Review of Systems  All other systems reviewed and are negative.     Objective:     BP 96/64   Pulse 68   Temp 98.4 F (36.9 C) (Oral)   Ht 5' 9 (1.753 m)   Wt 138 lb 11.2 oz (62.9 kg)   LMP 09/24/2023 (Exact Date)   SpO2 98%   BMI 20.48 kg/m    Physical Exam Vitals reviewed. Exam conducted with a chaperone present.  Constitutional:      Appearance: She is normal weight.  Cardiovascular:     Rate and Rhythm: Normal rate and regular rhythm.     Pulses: Normal pulses.     Heart sounds: Normal heart sounds.  Pulmonary:     Effort: Pulmonary effort is normal.     Breath sounds: Normal breath sounds.  Chest:     Chest wall: No mass.  Breasts:    Tanner Score is 5.     Right: Normal. No mass or tenderness.     Left: Normal. No mass or tenderness.  Abdominal:     General: Abdomen is flat. Bowel sounds are normal.     Palpations: Abdomen is soft.  Genitourinary:    General: Normal vulva.     Exam position: Lithotomy position.     Tanner stage  (genital): 5.     Vagina: Normal.     Cervix: Normal.     Uterus: Normal.      Adnexa: Right adnexa normal and left adnexa normal.     Rectum: Normal.  Lymphadenopathy:     Upper Body:     Right upper body: No axillary adenopathy.     Left upper body: No axillary adenopathy.  Neurological:     General: No focal deficit present.     Mental Status: She is alert and oriented to person, place, and time.  Psychiatric:        Mood and Affect: Mood and affect normal.      No results found for any visits on 10/06/23.    The ASCVD Risk score (Arnett DK, et al., 2019) failed to calculate for the following reasons:   The 2019 ASCVD risk score is only valid for ages 80 to 3    Assessment & Plan:  Vaginal discharge -     Cervicovaginal ancillary only  Screening for malignant neoplasm of cervix -     Cytology - PAP  Breast screening  Well woman exam performed, counseling on birth control options given. Pt  did have some whitish clumpy vaginal discharge, suspect yeast infection, swab sent to confirm, if positive will send in medication to treat. Pt given handouts on birth control options and encouraged to call us  if she decides she wants to try them.   Return in about 1 year (around 10/05/2024) for annual physical exam.    Heron CHRISTELLA Sharper, MD

## 2023-10-07 LAB — CERVICOVAGINAL ANCILLARY ONLY
Bacterial Vaginitis (gardnerella): POSITIVE — AB
Candida Glabrata: NEGATIVE
Candida Vaginitis: POSITIVE — AB
Chlamydia: NEGATIVE
Comment: NEGATIVE
Comment: NEGATIVE
Comment: NEGATIVE
Comment: NEGATIVE
Comment: NEGATIVE
Comment: NORMAL
Neisseria Gonorrhea: NEGATIVE
Trichomonas: NEGATIVE

## 2023-10-08 ENCOUNTER — Encounter: Payer: Self-pay | Admitting: Family Medicine

## 2023-10-08 DIAGNOSIS — B9689 Other specified bacterial agents as the cause of diseases classified elsewhere: Secondary | ICD-10-CM

## 2023-10-08 DIAGNOSIS — B3731 Acute candidiasis of vulva and vagina: Secondary | ICD-10-CM

## 2023-10-08 MED ORDER — METRONIDAZOLE 0.75 % VA GEL: 1.0000 | Freq: Every day | VAGINAL | 0 refills | Status: AC

## 2023-10-08 MED ORDER — FLUCONAZOLE 150 MG PO TABS: 150.0000 mg | ORAL_TABLET | ORAL | 0 refills | Status: AC | PRN

## 2023-10-12 LAB — CYTOLOGY - PAP
Comment: NEGATIVE
Diagnosis: NEGATIVE
High risk HPV: NEGATIVE

## 2023-10-14 ENCOUNTER — Ambulatory Visit: Payer: Self-pay | Admitting: Family Medicine

## 2024-02-16 DIAGNOSIS — N760 Acute vaginitis: Secondary | ICD-10-CM | POA: Diagnosis not present

## 2024-02-16 DIAGNOSIS — R070 Pain in throat: Secondary | ICD-10-CM | POA: Diagnosis not present

## 2024-02-16 DIAGNOSIS — B9789 Other viral agents as the cause of diseases classified elsewhere: Secondary | ICD-10-CM | POA: Diagnosis not present

## 2024-02-16 DIAGNOSIS — J038 Acute tonsillitis due to other specified organisms: Secondary | ICD-10-CM | POA: Diagnosis not present
